# Patient Record
Sex: Female | Born: 1965 | Hispanic: Yes | Marital: Married
Health system: Southern US, Community
[De-identification: ages and names within clinical notes are randomized; demographics above are authoritative.]

## PROBLEM LIST (undated history)

## (undated) DIAGNOSIS — I10 Essential (primary) hypertension: Secondary | ICD-10-CM

## (undated) HISTORY — DX: Essential (primary) hypertension: I10

---

## 2005-08-24 ENCOUNTER — Ambulatory Visit: Payer: Self-pay | Admitting: Family Medicine

## 2006-04-29 ENCOUNTER — Ambulatory Visit: Payer: Self-pay | Admitting: Family Medicine

## 2006-07-17 ENCOUNTER — Observation Stay: Payer: Self-pay | Admitting: Obstetrics and Gynecology

## 2009-02-06 ENCOUNTER — Ambulatory Visit: Payer: Self-pay | Admitting: Obstetrics and Gynecology

## 2010-03-25 ENCOUNTER — Ambulatory Visit: Payer: Self-pay | Admitting: Family

## 2010-09-09 ENCOUNTER — Ambulatory Visit: Payer: Self-pay | Admitting: Family

## 2010-10-08 ENCOUNTER — Emergency Department: Payer: Self-pay | Admitting: Internal Medicine

## 2013-10-31 ENCOUNTER — Ambulatory Visit: Payer: Self-pay

## 2016-10-18 ENCOUNTER — Ambulatory Visit: Payer: Self-pay

## 2016-10-25 ENCOUNTER — Ambulatory Visit
Admission: RE | Admit: 2016-10-25 | Discharge: 2016-10-25 | Disposition: A | Discharge status: Home / Self Care | Payer: Self-pay | Source: Ambulatory Visit | Attending: Oncology | Admitting: Oncology

## 2016-10-25 ENCOUNTER — Ambulatory Visit: Payer: Self-pay | Attending: Oncology

## 2016-10-25 ENCOUNTER — Encounter (INDEPENDENT_AMBULATORY_CARE_PROVIDER_SITE_OTHER): Payer: Self-pay

## 2016-10-25 VITALS — BP 122/84 | HR 61 | Temp 97.8°F | Ht 60.24 in | Wt 141.4 lb

## 2016-10-25 DIAGNOSIS — Z0000 Encounter for general adult medical examination without abnormal findings: Secondary | ICD-10-CM

## 2016-10-25 NOTE — Progress Notes (Signed)
Subjective:     Patient ID: Rhonda Morrison, female   DOB: 10-26-1965, 51 y.o.   MRN: [id number]  HPI   Review of Systems     Objective:   Physical Exam  Pulmonary/Chest: Right breast exhibits no inverted nipple, no mass, no nipple discharge, no skin change and no tenderness. Left breast exhibits no inverted nipple, no mass, no nipple discharge, no skin change and no tenderness. Breasts are asymmetrical.    Left breast greater than right       Assessment:     51 year old hispanic female with 6 children age 49-6, presents for [facility] clinic visit.  [facility], where patient had a normal pap in December 2017. Patient screened, and meets BCCCP eligibility.  Patient does not have insurance, Medicare or Medicaid.  Handout given on Affordable Care Act.  Instructed patient on breast self-exam using teach back method.  CBE unremarkable. No mass or lump palpated. Maritza Afanador interpreted exam.    Plan:     Sent for bilateral screening mammogram.

## 2016-12-08 NOTE — Progress Notes (Signed)
Letter mailed from [facility] to notify of normal mammogram results.  Patient to return in one year for annual screening.  Copy to [facility]. 

## 2019-12-20 ENCOUNTER — Ambulatory Visit: Payer: Self-pay | Attending: Internal Medicine

## 2019-12-20 DIAGNOSIS — Z23 Encounter for immunization: Secondary | ICD-10-CM

## 2019-12-20 NOTE — Progress Notes (Signed)
   Covid-19 Vaccination Clinic  Name:  Rhonda Morrison    MRN: [id number] DOB: 01/22/1966  12/20/2019  [name] was observed post Covid-19 immunization for 15 minutes without incident. She was provided with Vaccine Information Sheet and instruction to access the V-Safe system.   [name] was instructed to call 911 with any severe reactions post vaccine: Marland Kitchen Difficulty breathing  . Swelling of face and throat  . A fast heartbeat  . A bad rash all over body  . Dizziness and weakness   Immunizations Administered    Name Date Dose VIS Date Route   Pfizer COVID-19 Vaccine 12/20/2019  6:53 PM 0.3 mL 08/31/2019 Intramuscular   Manufacturer: [employer], [employer]   Lot: [id number]   NDC: [address]-8

## 2020-01-11 ENCOUNTER — Ambulatory Visit: Payer: Self-pay

## 2020-04-30 ENCOUNTER — Ambulatory Visit
Admission: RE | Admit: 2020-04-30 | Discharge: 2020-04-30 | Disposition: A | Discharge status: Home / Self Care | Payer: Self-pay | Source: Ambulatory Visit | Attending: Oncology | Admitting: Oncology

## 2020-04-30 ENCOUNTER — Other Ambulatory Visit: Payer: Self-pay

## 2020-04-30 ENCOUNTER — Encounter: Payer: Self-pay | Admitting: *Deleted

## 2020-04-30 ENCOUNTER — Ambulatory Visit: Payer: Self-pay | Attending: Oncology | Admitting: *Deleted

## 2020-04-30 VITALS — BP 136/78 | HR 66 | Temp 98.3°F | Ht 59.5 in | Wt 158.0 lb

## 2020-04-30 DIAGNOSIS — Z0000 Encounter for general adult medical examination without abnormal findings: Secondary | ICD-10-CM | POA: Insufficient documentation

## 2020-04-30 NOTE — Patient Instructions (Signed)
Gave patient hand-out, Women Staying Healthy, Active and Well from B[facility], with education on breast health, pap smears, heart and colon health. 

## 2020-04-30 NOTE — Progress Notes (Signed)
  Subjective:     Patient ID: Rhonda Morrison, female   DOB: 02/23/1966, 54 y.o.   MRN: [id number]  HPI   [facility] Medical History Record - 04/30/20 1323      Breast History   Screening cycle Rescreen    CBE Date 10/25/16    Provider (CBE) [facility]    Initial Mammogram 04/30/20    Last Mammogram Annual    Last Mammogram Date 10/25/16    Provider (Mammogram)  Delford Field    Recent Breast Symptoms None      Breast Cancer History   Breast Cancer History No personal or family history      Previous History of Breast Problems   Breast Surgery or Biopsy None    Breast Implants N/A    BSE Done Monthly      Gynecological/Obstetrical History   LMP 05/04/17    Is there any chance that the client could be pregnant?  No    Age at menarche 40    Age at menopause 27    PAP smear history Annually    Date of last PAP  03/05/20    Provider (PAP) [facility]    Age at first live birth 86    Breast fed children Yes (type length in comments)   2 years   DES Exposure No    Cervical, Uterine or Ovarian cancer No    Family history of Cervial, Uterine or Ovarian cancer No    Hysterectomy No    Cervix removed No    Ovaries removed No    Laser/Cryosurgery No    Current method of birth control None    Current method of Estrogen/Hormone replacement None    Smoking history None            Review of Systems     Objective:   Physical Exam Chest:     Breasts:        Right: No swelling, bleeding, inverted nipple, mass, nipple discharge, skin change or tenderness.        Left: No swelling, bleeding, inverted nipple, mass, nipple discharge, skin change or tenderness.    Lymphadenopathy:     Upper Body:     Right upper body: No supraclavicular or axillary adenopathy.     Left upper body: No supraclavicular or axillary adenopathy.        Assessment:     54 year old Hispanic female returns to [facility] for annual screening.  Loyda, the interpreter present during the interview and exam.   Clinical breast exam unremarkable.  Taught self breast awareness.  Last pap in June of 2021 at the [facility].  To follow up per ASCCP guidelines.  Patient has been screened for eligibility.  She does not have any insurance, Medicare or Medicaid.  She also meets financial eligibility.   Risk Assessment    Risk Scores      04/30/2020   Last edited by: Jim Like, RN   5-year risk: 0.5 %   Lifetime risk: 4.5 %            Plan:     Screening mammogram ordered.  Will follow up per B[facility]CP protocol.

## 2020-05-06 ENCOUNTER — Encounter: Payer: Self-pay | Admitting: *Deleted

## 2020-05-06 NOTE — Progress Notes (Signed)
Letter mailed from the [facility] to inform patient of her normal mammogram results.  Patient is to follow-up with annual screening in one year. 

## 2022-05-16 IMAGING — MG DIGITAL SCREENING BILAT W/ TOMO W/ CAD
8 series · 8 of 24 positions shown · non-contrast
Comparison: Previous exam(s).

CLINICAL DATA: Screening.

EXAM:
DIGITAL SCREENING BILATERAL MAMMOGRAM WITH TOMO AND CAD

[R CC synth-2D]
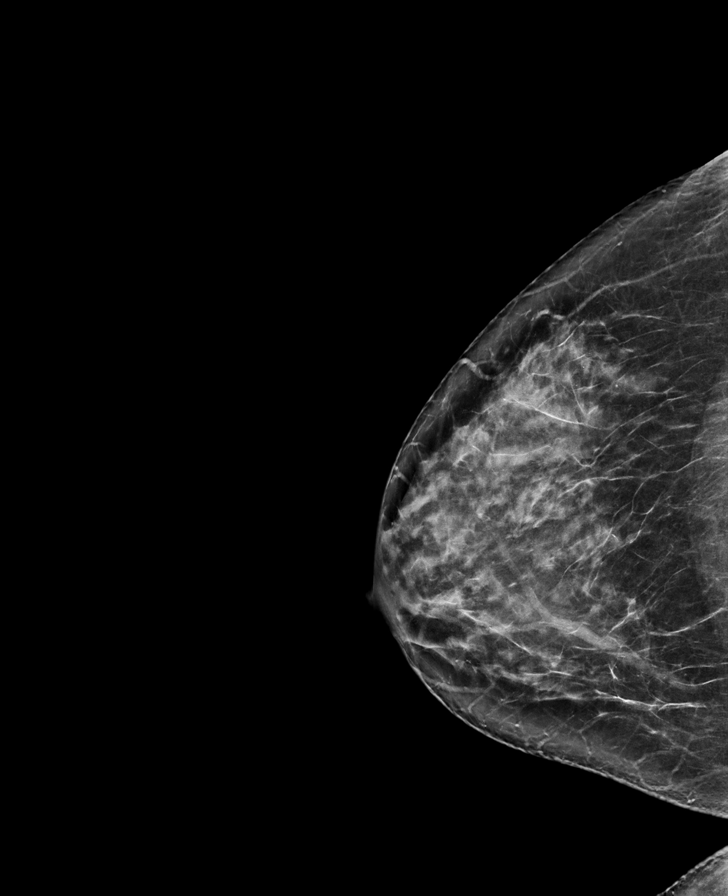

[L CC synth-2D]
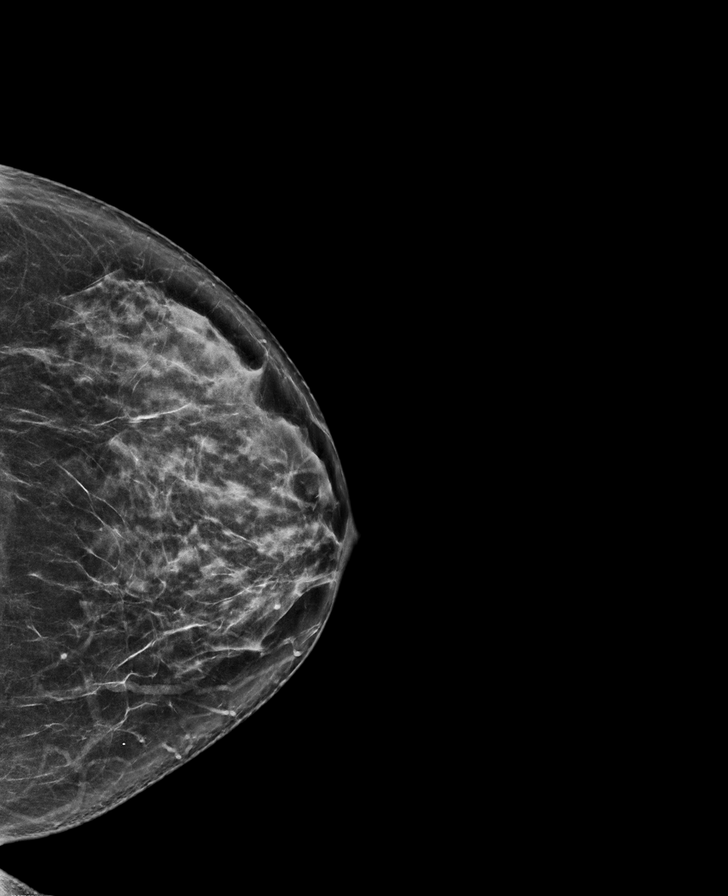

[R MLO synth-2D]
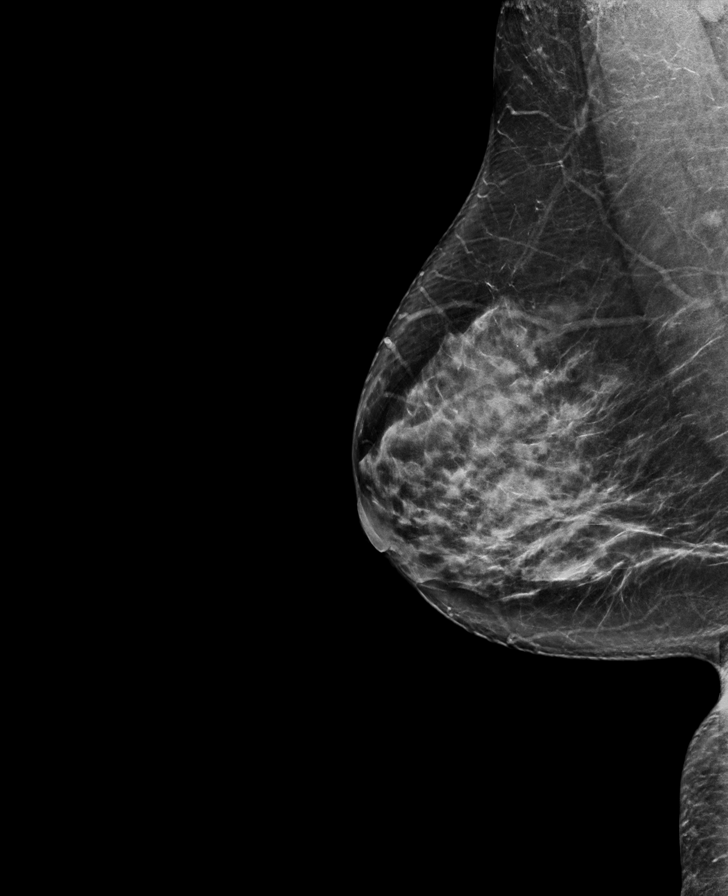

[L MLO synth-2D]
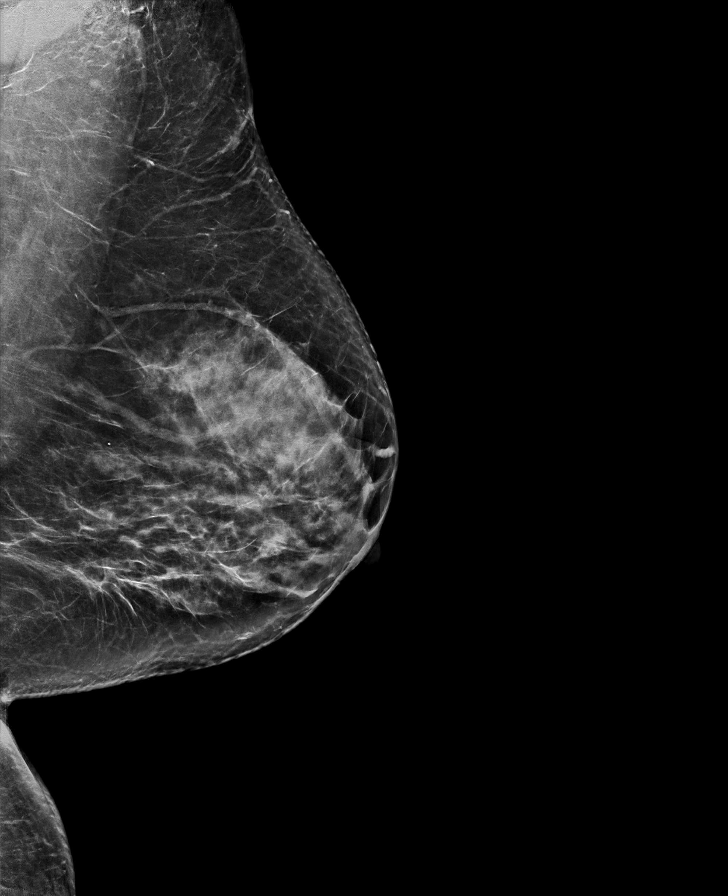

[R MLO tomo · tomo slice 39/78.0]
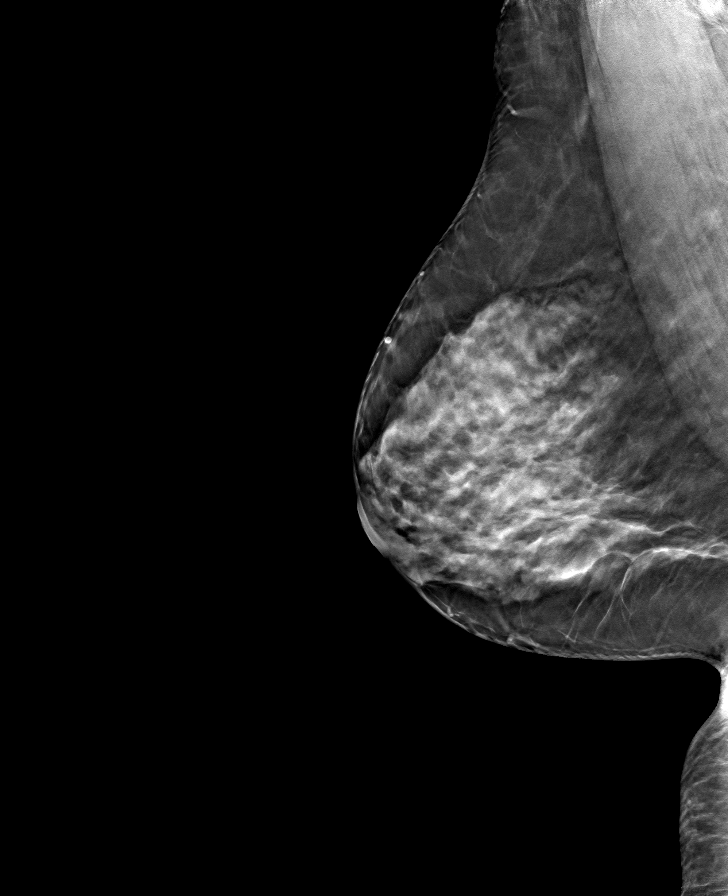

[R CC tomo · tomo slice 37/73.0]
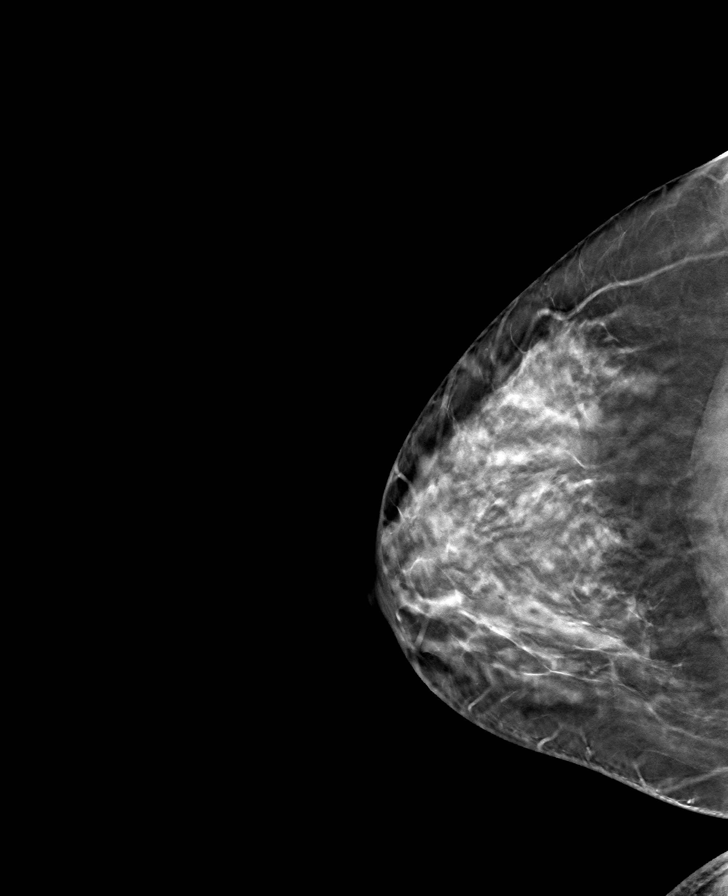

[L MLO tomo · tomo slice 39/78.0]
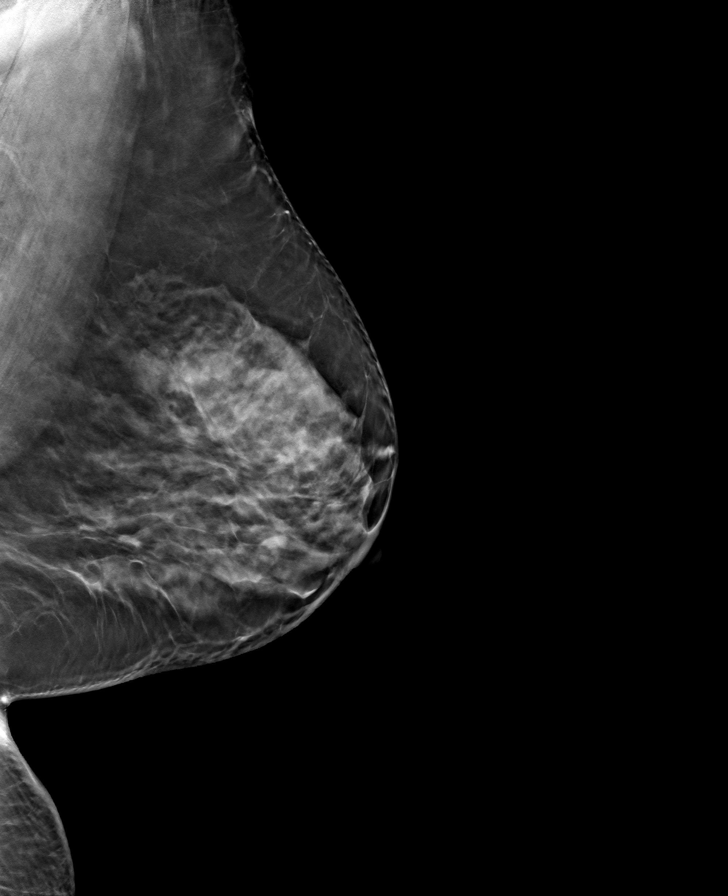

[L CC tomo · tomo slice 36/71.0]
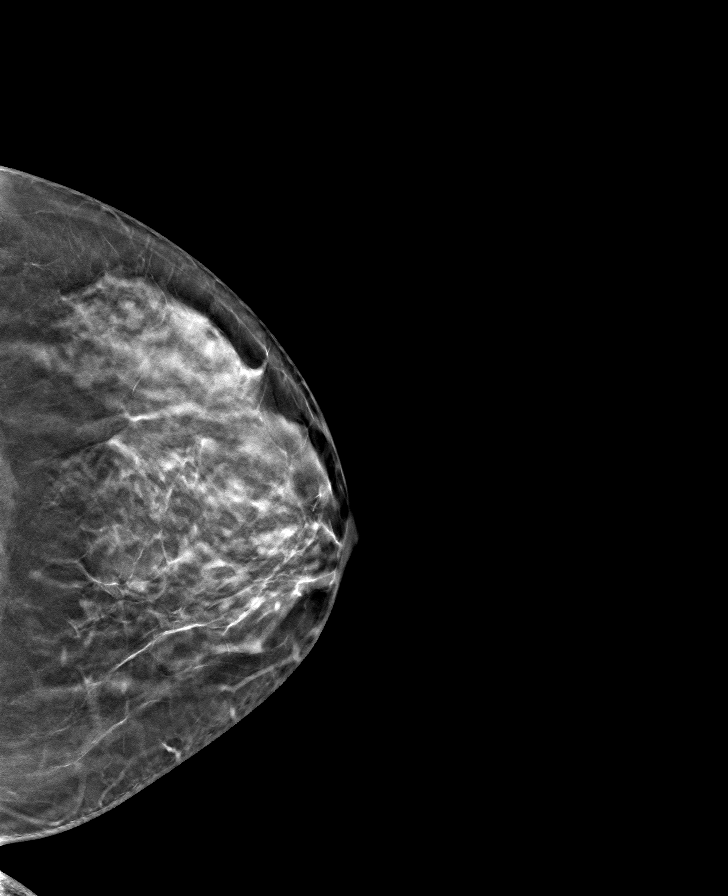

[8 of 24 positions shown; findings below may reference images not displayed]

ACR Breast Density Category c: The breast tissue is heterogeneously
dense, which may obscure small masses.
FINDINGS: There are no findings suspicious for malignancy. Images were
processed with CAD.
IMPRESSION: No mammographic evidence of malignancy. A result letter of this
screening mammogram will be mailed directly to the patient.

RECOMMENDATION:
Screening mammogram in one year. (Code:FT-U-LHB)

BI-RADS CATEGORY  1: Negative.

## 2022-11-26 ENCOUNTER — Encounter: Payer: Self-pay | Admitting: Family Medicine

## 2022-11-30 ENCOUNTER — Other Ambulatory Visit: Payer: Self-pay

## 2022-11-30 DIAGNOSIS — Z1231 Encounter for screening mammogram for malignant neoplasm of breast: Secondary | ICD-10-CM

## 2022-12-20 ENCOUNTER — Ambulatory Visit: Payer: Self-pay

## 2023-05-27 ENCOUNTER — Encounter: Payer: Self-pay | Admitting: Family Medicine

## 2023-08-22 ENCOUNTER — Ambulatory Visit: Payer: Self-pay | Attending: Hematology and Oncology | Admitting: Hematology and Oncology

## 2023-08-22 ENCOUNTER — Other Ambulatory Visit: Payer: Self-pay

## 2023-08-22 ENCOUNTER — Ambulatory Visit
Admission: RE | Admit: 2023-08-22 | Discharge: 2023-08-22 | Disposition: A | Discharge status: Home / Self Care | Payer: Self-pay | Source: Ambulatory Visit | Attending: Obstetrics and Gynecology | Admitting: Obstetrics and Gynecology

## 2023-08-22 VITALS — BP 151/79 | Wt 175.4 lb

## 2023-08-22 DIAGNOSIS — Z124 Encounter for screening for malignant neoplasm of cervix: Secondary | ICD-10-CM

## 2023-08-22 DIAGNOSIS — Z1231 Encounter for screening mammogram for malignant neoplasm of breast: Secondary | ICD-10-CM

## 2023-08-22 DIAGNOSIS — Z01419 Encounter for gynecological examination (general) (routine) without abnormal findings: Secondary | ICD-10-CM

## 2023-08-22 NOTE — Patient Instructions (Signed)
Taught Rhonda Morrison about self breast awareness and gave educational materials to take home. Patient did need a Pap smear today due to last Pap smear was in 03/27/2020 per patient. Let her know [facility] will cover Pap smears every 5 years unless has a history of abnormal Pap smears. Referred patient to the [facility] for screening mammogram. Appointment scheduled for 08/22/2023. Patient aware of appointment and will be there. Let patient know will follow up with her within the next couple weeks with results. [name] verbalized understanding.  Pascal Lux, NP 10:57 AM

## 2023-08-22 NOTE — Progress Notes (Signed)
[name] is a 57 y.o. No obstetric history on file. female who presents to [facility] clinic today with no complaints.    Pap Smear: Pap smear completed today. Last Pap smear was 03/27/2020 at [facility] and was normal. Per patient has no history of an abnormal Pap smear. Last Pap smear result is available in Epic.   Physical exam: Breasts Breasts symmetrical. No skin abnormalities bilateral breasts. No nipple retraction bilateral breasts. No nipple discharge bilateral breasts. No lymphadenopathy. No lumps palpated bilateral breasts.     MS DIGITAL SCREENING TOMO BILATERAL  Result Date: 04/30/2020 CLINICAL DATA:  Screening. EXAM: DIGITAL SCREENING BILATERAL MAMMOGRAM WITH TOMO AND CAD COMPARISON:  Previous exam(s). ACR Breast Density Category c: The breast tissue is heterogeneously dense, which may obscure small masses. FINDINGS: There are no findings suspicious for malignancy. Images were processed with CAD. IMPRESSION: No mammographic evidence of malignancy. A result letter of this screening mammogram will be mailed directly to the patient. RECOMMENDATION: Screening mammogram in one year. (Code:[id number]) BI-RADS CATEGORY  1: Negative. Electronically Signed   By: Frederico Hamman M.D.   On: 04/30/2020 14:12      Pelvic/Bimanual Ext Genitalia No lesions, no swelling and no discharge observed on external genitalia.        Vagina Vagina pink and normal texture. No lesions or discharge observed in vagina.        Cervix Cervix is present. Cervix pink and of normal texture. No discharge observed.    Uterus Uterus is present and palpable. Uterus in normal position and normal size.        Adnexae Bilateral ovaries present and palpable. No tenderness on palpation.         Rectovaginal No rectal exam completed today since patient had no rectal complaints. No skin abnormalities observed on exam.     Smoking History: Patient has never smoked and was not referred to quit line.     Patient Navigation: Patient education provided. Access to services provided for patient through B[facility]CP program. Delos Haring interpreter provided. No transportation provided   Colorectal Cancer Screening: Per patient has never had colonoscopy completed No complaints today. FIT test completed 12/08/2022 - negative   Breast and Cervical Cancer Risk Assessment: Patient does not have family history of breast cancer, known genetic mutations, or radiation treatment to the chest before age 91. Patient does not have history of cervical dysplasia, immunocompromised, or DES exposure in-utero.  Risk Scores as of Encounter on 08/22/2023     [name]           5-year 0.82%   Lifetime 6.09%   This patient is Hispana/Latina but has no documented birth country, so the [place] model used data from [place] patients to calculate their risk score. Document a birth country in the Demographics activity for a more accurate score.         Last calculated by Narda Rutherford, LPN on 66/0/6301 at 10:25 AM        A: [facility] exam with pap smear No complaints with benign exam.   P: Referred patient to the [facility] for a screening mammogram. Appointment scheduled 08/22/2023.  Pascal Lux, NP 08/22/2023 10:55 AM

## 2023-08-25 ENCOUNTER — Telehealth: Payer: Self-pay

## 2023-08-25 ENCOUNTER — Other Ambulatory Visit: Payer: Self-pay | Admitting: Hematology and Oncology

## 2023-08-25 LAB — CYTOLOGY - PAP
Adequacy: ABSENT
Comment: NEGATIVE
Diagnosis: NEGATIVE
High risk HPV: NEGATIVE

## 2023-08-25 MED ORDER — METRONIDAZOLE 500 MG PO TABS: 500.0000 mg | ORAL_TABLET | Freq: Two times a day (BID) | ORAL | 0 refills | Status: AC

## 2023-08-25 NOTE — Telephone Encounter (Signed)
Via, Kristeen Mans, [facility] Spanish Interpreter, Patient informed negative Pap/HPV results, repeat pap in 5 years, did reveal BV. Discussed BV with patient, rx metronidazole, take 1 po bid x 7 days, avoid alcohol, call if no improvement or worsens. Patient verbalized understanding. Rx sent to [facility] per Ilda Basset, FNP-BC.
